# Patient Record
Sex: Male | Born: 1985 | Race: Black or African American | Hispanic: No | Marital: Single
Health system: Southern US, Community
[De-identification: ages and names within clinical notes are randomized; demographics above are authoritative.]

---

## 2016-03-21 ENCOUNTER — Emergency Department (HOSPITAL_COMMUNITY)
Admission: EM | Admit: 2016-03-21 | Discharge: 2016-03-21 | Disposition: A | Discharge status: Home / Self Care | Payer: No Typology Code available for payment source | Attending: Emergency Medicine | Admitting: Emergency Medicine

## 2016-03-21 ENCOUNTER — Emergency Department (HOSPITAL_COMMUNITY): Payer: No Typology Code available for payment source

## 2016-03-21 DIAGNOSIS — Y998 Other external cause status: Secondary | ICD-10-CM | POA: Diagnosis not present

## 2016-03-21 DIAGNOSIS — S29001A Unspecified injury of muscle and tendon of front wall of thorax, initial encounter: Secondary | ICD-10-CM | POA: Diagnosis present

## 2016-03-21 DIAGNOSIS — Y92410 Unspecified street and highway as the place of occurrence of the external cause: Secondary | ICD-10-CM | POA: Insufficient documentation

## 2016-03-21 DIAGNOSIS — Z79899 Other long term (current) drug therapy: Secondary | ICD-10-CM | POA: Diagnosis not present

## 2016-03-21 DIAGNOSIS — Y9389 Activity, other specified: Secondary | ICD-10-CM | POA: Insufficient documentation

## 2016-03-21 DIAGNOSIS — J45909 Unspecified asthma, uncomplicated: Secondary | ICD-10-CM | POA: Diagnosis not present

## 2016-03-21 DIAGNOSIS — S20219A Contusion of unspecified front wall of thorax, initial encounter: Secondary | ICD-10-CM | POA: Insufficient documentation

## 2016-03-21 NOTE — Discharge Instructions (Signed)
Your chest xray is normal Asthma, Adult Asthma is a condition of the lungs in which the airways tighten and narrow. Asthma can make it hard to breathe. Asthma cannot be cured, but medicine and lifestyle changes can help control it. Asthma may be started (triggered) by:  Animal skin flakes (dander).  Dust.  Cockroaches.  Pollen.  Mold.  Smoke.  Cleaning products.  Hair sprays or aerosol sprays.  Paint fumes or strong smells.  Cold air, weather changes, and winds.  Crying or laughing hard.  Stress.  Certain medicines or drugs.  Foods, such as dried fruit, potato chips, and sparkling grape juice.  Infections or conditions (colds, flu).  Exercise.  Certain medical conditions or diseases.  Exercise or tiring activities. HOME CARE   Take medicine as told by your doctor.  Use a peak flow meter as told by your doctor. A peak flow meter is a tool that measures how well the lungs are working.  Record and keep track of the peak flow meter's readings.  Understand and use the asthma action plan. An asthma action plan is a written plan for taking care of your asthma and treating your attacks.  To help prevent asthma attacks:  Do not smoke. Stay away from secondhand smoke.  Change your heating and air conditioning filter often.  Limit your use of fireplaces and wood stoves.  Get rid of pests (such as roaches and mice) and their droppings.  Throw away plants if you see mold on them.  Clean your floors. Dust regularly. Use cleaning products that do not smell.  Have someone vacuum when you are not home. Use a vacuum cleaner with a HEPA filter if possible.  Replace carpet with wood, tile, or vinyl flooring. Carpet can trap animal skin flakes and dust.  Use allergy-proof pillows, mattress covers, and box spring covers.  Wash bed sheets and blankets every week in hot water and dry them in a dryer.  Use blankets that are made of polyester or cotton.  Clean bathrooms  and kitchens with bleach. If possible, have someone repaint the walls in these rooms with mold-resistant paint. Keep out of the rooms that are being cleaned and painted.  Wash hands often. GET HELP IF:  You have make a whistling sound when breaking (wheeze), have shortness of breath, or have a cough even if taking medicine to prevent attacks.  The colored mucus you cough up (sputum) is thicker than usual.  The colored mucus you cough up changes from clear or white to yellow, green, gray, or bloody.  You have problems from the medicine you are taking such as:  A rash.  Itching.  Swelling.  Trouble breathing.  You need reliever medicines more than 2-3 times a week.  Your peak flow measurement is still at 50-79% of your personal best after following the action plan for 1 hour.  You have a fever. GET HELP RIGHT AWAY IF:   You seem to be worse and are not responding to medicine during an asthma attack.  You are short of breath even at rest.  You get short of breath when doing very little activity.  You have trouble eating, drinking, or talking.  You have chest pain.  You have a fast heartbeat.  Your lips or fingernails start to turn blue.  You are light-headed, dizzy, or faint.  Your peak flow is less than 50% of your personal best.   This information is not intended to replace advice given to you by your  health care provider. Make sure you discuss any questions you have with your health care provider.   Document Released: 05/23/2008 Document Revised: 08/26/2015 Document Reviewed: 07/04/2013 Elsevier Interactive Patient Education [employer]2016 [employer]
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
